# Patient Record
Sex: Female | Born: 1995
Health system: Southern US, Community
[De-identification: ages and names within clinical notes are randomized; demographics above are authoritative.]

## PROBLEM LIST (undated history)

## (undated) DIAGNOSIS — B86 Scabies: Secondary | ICD-10-CM

## (undated) HISTORY — DX: Scabies: B86

---
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
## 2017-08-10 NOTE — Progress Notes (Deleted)
   Subjective   Patient ID: Amy Farmer    DOB: 10-21-1995, 22 y.o. female   MRN: [id number]  CC: "***"  HPI: Amy Farmer is a 22 y.o. female who presents to clinic today for the following:  ***: ***  ROS: see HPI for pertinent.  PMFSH: None.  Surgical history joint replacement.  Family history unremarkable.  Smoking status reviewed.  Medications reviewed.  Objective   There were no vitals taken for this visit. Vitals and nursing note reviewed.  General: well nourished, well developed, NAD with non-toxic appearance HEENT: normocephalic, atraumatic, moist mucous membranes Neck: supple, non-tender without lymphadenopathy Cardiovascular: regular rate and rhythm without murmurs, rubs, or gallops Lungs: clear to auscultation bilaterally with normal work of breathing Abdomen: soft, non-tender, non-distended, normoactive bowel sounds Skin: warm, dry, no rashes or lesions, cap refill < 2 seconds Extremities: warm and well perfused, normal tone, no edema  Assessment & Plan   No problem-specific Assessment & Plan notes found for this encounter.  No orders of the defined types were placed in this encounter.  No orders of the defined types were placed in this encounter.   Durward Parcelavid McMullen, DO [facility], PGY-3 08/10/2017, 9:39 AM

## 2017-08-11 ENCOUNTER — Encounter: Payer: Medicaid Other | Admitting: Family Medicine

## 2017-08-18 ENCOUNTER — Ambulatory Visit (INDEPENDENT_AMBULATORY_CARE_PROVIDER_SITE_OTHER): Payer: Medicaid Other | Admitting: Family Medicine

## 2017-08-18 ENCOUNTER — Encounter: Payer: Self-pay | Admitting: Family Medicine

## 2017-08-18 VITALS — HR 94 | Temp 98.0°F | Ht 63.5 in | Wt 107.8 lb

## 2017-08-18 DIAGNOSIS — J029 Acute pharyngitis, unspecified: Secondary | ICD-10-CM

## 2017-08-18 DIAGNOSIS — R059 Cough, unspecified: Secondary | ICD-10-CM

## 2017-08-18 DIAGNOSIS — H938X3 Other specified disorders of ear, bilateral: Secondary | ICD-10-CM | POA: Diagnosis not present

## 2017-08-18 DIAGNOSIS — R05 Cough: Secondary | ICD-10-CM | POA: Diagnosis not present

## 2017-08-18 LAB — POCT RAPID STREP A (OFFICE): Rapid Strep A Screen: NEGATIVE

## 2017-08-18 MED ORDER — AMOXICILLIN-POT CLAVULANATE 875-125 MG PO TABS: 1.0000 | ORAL_TABLET | Freq: Two times a day (BID) | ORAL | 0 refills | Status: AC

## 2017-08-18 NOTE — Patient Instructions (Signed)
It was great seeing you today! I am sorry that you have had so much trouble with your sore throat, congestion, sinus pressure, and ear pressure. Your rapid strep was negative. There is a false negative rate with this test. You do have bulging ear drums which can indicate a true bacterial sinus infection. I will give you an antibiotic called augmentin. You will take this every 12 hours for 7 days. Please finish this even if you are feeling better. Please come back and see us if you are not feeling any better in 3-4 days.  Be sure and hydrate well and get plenty of sleep. Water, gatorade/powerade will help keep you hydrated. Good over the counter options are nyquil, robitussin cold and flu, and benzocaine lozenges. These will help symptomatically while the antibiotics help get rid of the bacteria. I gave you a work excuse for 7/17 and 7/18.

## 2017-08-19 ENCOUNTER — Encounter: Payer: Self-pay | Admitting: Family Medicine

## 2017-08-19 DIAGNOSIS — R059 Cough, unspecified: Secondary | ICD-10-CM | POA: Insufficient documentation

## 2017-08-19 DIAGNOSIS — J029 Acute pharyngitis, unspecified: Secondary | ICD-10-CM | POA: Insufficient documentation

## 2017-08-19 DIAGNOSIS — H938X3 Other specified disorders of ear, bilateral: Secondary | ICD-10-CM | POA: Insufficient documentation

## 2017-08-19 DIAGNOSIS — R05 Cough: Secondary | ICD-10-CM | POA: Insufficient documentation

## 2017-08-19 NOTE — Progress Notes (Signed)
   HPI 22 year old with 2 day history of progressively worsening sore throat, sinus congestion, and ear pain. Patient states that she was in her usual state of health but noticed her throat getting increasingly "scratchy" on 7/16. Has continued to worsen since. She also states that around the same time she developed congestion, bilateral ear pain, and cough. Has been intermittently febrile. Took an advil prior to coming to the office.  Rapid strep performed and was negative. Has had sick nephew and sick sister with very similar symptoms.  CC: sore throat   ROS:  Review of Systems See HPI for ROS.   CC, SH/smoking status, and VS noted  Objective: Pulse 94   Temp 98 F (36.7 C) (Oral)   Ht 5' 3.5" (1.613 m)   Wt 107 lb 12.8 oz (48.9 kg)   SpO2 99%   BMI 18.80 kg/m  Gen: NAD, alert, cooperative, and pleasant. Thin, caucasian female, resting comfortably in bed HEENT: NCAT, EOMI, PERRL. Tender, erythematous posterior pharynx. Bilateral bulging TMs. Swollen bilateral cervical lymph nodes CV: RRR, no murmur Resp: CTAB, no wheezes, non-labored Abd: SNTND, BS present, no guarding or organomegaly Ext: No edema, warm Neuro: Alert and oriented, Speech clear, No gross deficits   Assessment and plan:  Sore throat Strep negative. Sore throat likely 2/2 runoff from sinus congestion. Can treat with benzocain lozenges. Cannot rule out strep infection despite rapid strep negative given other symptoms. Augmentin prescribed for otitis media will cover pharyngeal flora. - benzocaine lozenges - augmentin 875/125 bid for 7 days  Congestion of both ears Patient with likely bilateral otitis media. Will treat with augmentin bid for 7 days.  Cough Has been taking OTC nyquil. Can continue to take this as needed for symptomatic relief. Will resolve with infection.   Orders Placed This Encounter  Procedures  . POCT rapid strep A    Meds ordered this encounter  Medications  .  amoxicillin-clavulanate (AUGMENTIN) 875-125 MG tablet    Sig: Take 1 tablet by mouth every 12 (twelve) hours for 7 days.    Dispense:  14 tablet    Refill:  0     Myrene BuddyJacob Aviv Rota MD PGY-2 Family Medicine Resident  08/19/2017 12:50 PM

## 2017-08-19 NOTE — Assessment & Plan Note (Signed)
Strep negative. Sore throat likely 2/2 runoff from sinus congestion. Can treat with benzocain lozenges. Cannot rule out strep infection despite rapid strep negative given other symptoms. Augmentin prescribed for otitis media will cover pharyngeal flora. - benzocaine lozenges - augmentin 875/125 bid for 7 days

## 2017-08-19 NOTE — Assessment & Plan Note (Signed)
Has been taking OTC nyquil. Can continue to take this as needed for symptomatic relief. Will resolve with infection.

## 2017-08-19 NOTE — Assessment & Plan Note (Signed)
Patient with likely bilateral otitis media. Will treat with augmentin bid for 7 days.

## 2017-09-08 NOTE — Progress Notes (Signed)
   Subjective   Patient ID: Amy Farmer    DOB: 04-Jan-1996, 22 y.o. female   MRN: [id number]  CC: "Routin physical"  HPI: Amy Farmer is a 22 y.o. female who presents to clinic today for the following:  Annual physical exam: Patient here today for routine annual physical without concerns.  She is a never smoker and denies alcohol or drug use.  She is up-to-date on vaccinations.  She was recently treated for sinusitis with Augmentin and completed treatment with resolution of symptoms with exception to mild nasal congestion.  She denies fevers or chills, nausea or vomiting, chest pain, shortness of breath, abdominal pain, diarrhea or constipation, syncope or fatigue.  Contraceptive management: Patient with historical use of OCPs with regular periods though discontinued after 8 months of use due to cramping.  She is currently using condoms distantly with intercourse.  She has one female partner for the past 3 years.  She has considered Depo-Provera but is interested in Nexplanon.  She is not interested in IUD.  She denies history of vaginal discharge or bleeding.  ROS: see HPI for pertinent.  PMFSH: None.  Surgical history unremarkable.  Family history unremarkable.  Smoking status reviewed. Medications reviewed.  Objective   BP (!) 96/58   Pulse 80   Temp 98.2 F (36.8 C) (Oral)   Ht 5\' 4"  (1.626 m)   Wt 111 lb 6.4 oz (50.5 kg)   SpO2 99%   BMI 19.12 kg/m  Vitals and nursing note reviewed.  General: well nourished, well developed, NAD with non-toxic appearance HEENT: normocephalic, atraumatic, moist mucous membranes Neck: supple, non-tender without lymphadenopathy Cardiovascular: regular rate and rhythm without murmurs, rubs, or gallops Lungs: clear to auscultation bilaterally with normal work of breathing Abdomen: soft, non-tender, non-distended, normoactive bowel sounds Skin: warm, dry, no rashes or lesions, cap refill < 2 seconds Extremities: warm and well  perfused, normal tone, no edema  Assessment & Plan   Contraceptive management Prior history of estrogen and progesterone based OCPs with cramping.  Currently condoms only.  Interested in [place]. - Given information regarding Nexplanon and scheduled appointment on 10/05/2017 for insertion - Discussed importance of safe sex practices including condom use to prevent STDs and pregnancy  Encounter for annual physical examination excluding gynecological examination in a patient older than 17 years No complaints today.  Not of age to begin Pap smear.  Patient reports completion of 3 series HPV vaccine.  She is sexually active without symptoms concerning for STD and is not interested in screening.   - Discussed importance of diet and exercise - We will begin Pap smear next year  No orders of the defined types were placed in this encounter.  No orders of the defined types were placed in this encounter.   Durward Parcelavid McMullen, DO [facility], PGY-3 09/09/2017, 1:44 PM

## 2017-09-09 ENCOUNTER — Ambulatory Visit (INDEPENDENT_AMBULATORY_CARE_PROVIDER_SITE_OTHER): Payer: Medicaid Other | Admitting: Family Medicine

## 2017-09-09 ENCOUNTER — Encounter: Payer: Self-pay | Admitting: Family Medicine

## 2017-09-09 ENCOUNTER — Other Ambulatory Visit: Payer: Self-pay

## 2017-09-09 VITALS — BP 96/58 | HR 80 | Temp 98.2°F | Ht 64.0 in | Wt 111.4 lb

## 2017-09-09 DIAGNOSIS — Z309 Encounter for contraceptive management, unspecified: Secondary | ICD-10-CM | POA: Insufficient documentation

## 2017-09-09 DIAGNOSIS — Z0000 Encounter for general adult medical examination without abnormal findings: Secondary | ICD-10-CM | POA: Diagnosis not present

## 2017-09-09 DIAGNOSIS — Z3009 Encounter for other general counseling and advice on contraception: Secondary | ICD-10-CM | POA: Diagnosis not present

## 2017-09-09 NOTE — Assessment & Plan Note (Signed)
Prior history of estrogen and progesterone based OCPs with cramping.  Currently condoms only.  Interested in [place]. - Given information regarding Nexplanon and scheduled appointment on 10/05/2017 for insertion - Discussed importance of safe sex practices including condom use to prevent STDs and pregnancy

## 2017-09-09 NOTE — Patient Instructions (Addendum)
Thank you for coming in to see us today. Please see below to review our plan for today's visit.  We will see you again in a few weeks.  Please feel free to cancel if needed.  Please call the clinic at [phone] if your symptoms worsen or you have any concerns. It was our pleasure to serve you.  Durward Parcelavid McMullen, DO [facility], PGY-3  Etonogestrel implant What is this medicine? ETONOGESTREL (et oh noe JES trel) is a contraceptive (birth control) device. It is used to prevent pregnancy. It can be used for up to 3 years. This medicine may be used for other purposes; ask your health care provider or pharmacist if you have questions. COMMON BRAND NAME(S): Implanon, Nexplanon What should I tell my health care provider before I take this medicine? They need to know if you have any of these conditions: -abnormal vaginal bleeding -blood vessel disease or blood clots -cancer of the breast, cervix, or liver -depression -diabetes -gallbladder disease -headaches -heart disease or recent heart attack -high blood pressure -high cholesterol -kidney disease -liver disease -renal disease -seizures -tobacco smoker -an unusual or allergic reaction to etonogestrel, other hormones, anesthetics or antiseptics, medicines, foods, dyes, or preservatives -pregnant or trying to get pregnant -breast-feeding How should I use this medicine? This device is inserted just under the skin on the inner side of your upper arm by a health care professional. Talk to your pediatrician regarding the use of this medicine in children. Special care may be needed. Overdosage: If you think you have taken too much of this medicine contact a poison control center or emergency room at once. NOTE: This medicine is only for you. Do not share this medicine with others. What if I miss a dose? This does not apply. What may interact with this medicine? Do not take this medicine with any of the following  medications: -amprenavir -bosentan -fosamprenavir This medicine may also interact with the following medications: -barbiturate medicines for inducing sleep or treating seizures -certain medicines for fungal infections like ketoconazole and itraconazole -grapefruit juice -griseofulvin -medicines to treat seizures like carbamazepine, felbamate, oxcarbazepine, phenytoin, topiramate -modafinil -phenylbutazone -rifampin -rufinamide -some medicines to treat HIV infection like atazanavir, indinavir, lopinavir, nelfinavir, tipranavir, ritonavir -St. John's wort This list may not describe all possible interactions. Give your health care provider a list of all the medicines, herbs, non-prescription drugs, or dietary supplements you use. Also tell them if you smoke, drink alcohol, or use illegal drugs. Some items may interact with your medicine. What should I watch for while using this medicine? This product does not protect you against HIV infection (AIDS) or other sexually transmitted diseases. You should be able to feel the implant by pressing your fingertips over the skin where it was inserted. Contact your doctor if you cannot feel the implant, and use a non-hormonal birth control method (such as condoms) until your doctor confirms that the implant is in place. If you feel that the implant may have broken or become bent while in your arm, contact your healthcare provider. What side effects may I notice from receiving this medicine? Side effects that you should report to your doctor or health care professional as soon as possible: -allergic reactions like skin rash, itching or hives, swelling of the face, lips, or tongue -breast lumps -changes in emotions or moods -depressed mood -heavy or prolonged menstrual bleeding -pain, irritation, swelling, or bruising at the insertion site -scar at site of insertion -signs of infection at the insertion  site such as fever, and skin redness, pain or  discharge -signs of pregnancy -signs and symptoms of a blood clot such as breathing problems; changes in vision; chest pain; severe, sudden headache; pain, swelling, warmth in the leg; trouble speaking; sudden numbness or weakness of the face, arm or leg -signs and symptoms of liver injury like dark yellow or brown urine; general ill feeling or flu-like symptoms; light-colored stools; loss of appetite; nausea; right upper belly pain; unusually weak or tired; yellowing of the eyes or skin -unusual vaginal bleeding, discharge -signs and symptoms of a stroke like changes in vision; confusion; trouble speaking or understanding; severe headaches; sudden numbness or weakness of the face, arm or leg; trouble walking; dizziness; loss of balance or coordination Side effects that usually do not require medical attention (report to your doctor or health care professional if they continue or are bothersome): -acne -back pain -breast pain -changes in weight -dizziness -general ill feeling or flu-like symptoms -headache -irregular menstrual bleeding -nausea -sore throat -vaginal irritation or inflammation This list may not describe all possible side effects. Call your doctor for medical advice about side effects. You may report side effects to FDA at 1-800-FDA-1088. Where should I keep my medicine? This drug is given in a hospital or clinic and will not be stored at home. NOTE: This sheet is a summary. It may not cover all possible information. If you have questions about this medicine, talk to your doctor, pharmacist, or health care provider.  2018 Elsevier/Gold Standard (2015-08-08 11:19:22)

## 2017-09-09 NOTE — Assessment & Plan Note (Signed)
No complaints today.  Not of age to begin Pap smear.  Patient reports completion of 3 series HPV vaccine.  She is sexually active without symptoms concerning for STD and is not interested in screening.   - Discussed importance of diet and exercise - We will begin Pap smear next year

## 2017-09-30 ENCOUNTER — Other Ambulatory Visit: Payer: Self-pay

## 2017-09-30 ENCOUNTER — Ambulatory Visit (INDEPENDENT_AMBULATORY_CARE_PROVIDER_SITE_OTHER): Payer: Medicaid Other | Admitting: Family Medicine

## 2017-09-30 VITALS — BP 100/58 | HR 81 | Temp 98.4°F | Wt 108.0 lb

## 2017-09-30 DIAGNOSIS — J020 Streptococcal pharyngitis: Secondary | ICD-10-CM

## 2017-09-30 LAB — POCT RAPID STREP A (OFFICE): Rapid Strep A Screen: POSITIVE — AB

## 2017-09-30 MED ORDER — PENICILLIN V POTASSIUM 500 MG PO TABS: 500.0000 mg | ORAL_TABLET | Freq: Three times a day (TID) | ORAL | 0 refills | Status: DC

## 2017-09-30 NOTE — Assessment & Plan Note (Signed)
Positive strep throat swab.  We will treat with penicillin V 500 mg, 3 times daily for 10 days.  Gave return precautions.  Also recommended benzocaine lozenges for sore throat.

## 2017-09-30 NOTE — Patient Instructions (Addendum)
It was great seeing you again today! I am sorry that you have been having issues with your sore throat, cough, and congestion. Your strep test was positive. We will need to treat with antibiotics. For the sore throat you can do over the counter medications such as benzocaine lozenge to help sooth your throat. Continue to hydrate well

## 2017-09-30 NOTE — Progress Notes (Signed)
   HPI 22 year old who presents with 3-day history of sore throat.  Developed rhinorrhea and very slight cough 1 day prior to sore throat.  Also been feeling some ear pressure in bilateral ears.  Has been having no difficulty with p.o. Intake.  States that she has had a couple of close contacts with similar symptoms.  Tried nothing to relieve the pain.  Has had temperatures up to 100.0 at home.  Has taken Tylenol once.   Strep swab performed in clinic.  Was positive.   CC: sore throat   ROS:   Review of Systems See HPI for ROS.   CC, SH/smoking status, and VS noted  Objective: BP (!) 100/58   Pulse 81   Temp 98.4 F (36.9 C) (Oral)   Wt 108 lb (49 kg)   LMP 09/22/2017   SpO2 93%   BMI 18.54 kg/m  Gen: NAD, alert, cooperative, and pleasant. Young caucasian female in no distress HEENT: NCAT, EOMI, PERRL. Erythematous posterior pharynx. No purulence from tonsils CV: RRR, no m/r/g. Resp: CTAB, no wheezes, non-labored Abd: SNTND, BS present, no guarding or organomegaly Ext: No edema, warm Neuro: Alert and oriented, Speech clear, No gross deficits   Assessment and plan:  Strep pharyngitis Positive strep throat swab.  We will treat with penicillin V 500 mg, 3 times daily for 10 days.  Gave return precautions.  Also recommended benzocaine lozenges for sore throat.    Orders Placed This Encounter  Procedures  . Rapid Strep A    Meds ordered this encounter  Medications  . penicillin v potassium (VEETID) 500 MG tablet    Sig: Take 1 tablet (500 mg total) by mouth 3 (three) times daily.    Dispense:  30 tablet    Refill:  0     Myrene BuddyJacob Ellsworth Waldschmidt MD PGY-2 Family Medicine Resident  09/30/2017 1:30 PM

## 2017-10-05 ENCOUNTER — Ambulatory Visit: Payer: Medicaid Other | Admitting: Family Medicine

## 2017-10-05 NOTE — Progress Notes (Deleted)
   Subjective   Patient ID: Dyanne Carrel    DOB: [birth year], 22 y.o. female   MRN: [id number]  CC: "***"  HPI: ISSAMAR HUCKABAY is a 22 y.o. female who presents to clinic today for the following:  ***: ***  ROS: see HPI for pertinent.  PMFSH: ***. Smoking status reviewed. Medications reviewed.  Objective   LMP 09/22/2017  Vitals and nursing note reviewed.  General: well nourished, well developed, NAD with non-toxic appearance HEENT: normocephalic, atraumatic, moist mucous membranes Neck: supple, non-tender without lymphadenopathy Cardiovascular: regular rate and rhythm without murmurs, rubs, or gallops Lungs: clear to auscultation bilaterally with normal work of breathing Abdomen: soft, non-tender, non-distended, normoactive bowel sounds Skin: warm, dry, no rashes or lesions, cap refill < 2 seconds Extremities: warm and well perfused, normal tone, no edema  Procedure Note: Nexplanon Insertion Patient given informed consent for insertion of a Nexplanon. A signed copy is in the chart. Appropriate time out taken. Nexlanon site (*** arm) identified and thea area was prepped in usual sterile fashon. Two cc of 1% lidocaine was used to anesthetize the area starting with the distal end of the implant. Next, the area was cleansed again and the Nexplanon was inserted without difficulty. There were no complications. Pressure and bandage was applied. Minimal blood loss. Pt was instructed to remove pressure bandage in a few hours, and keep insertion site covered with a bandaid for 3 days. Follow-up scheduled PRN problems  Assessment & Plan   No problem-specific Assessment & Plan notes found for this encounter.  No orders of the defined types were placed in this encounter.  No orders of the defined types were placed in this encounter.   Durward Parcel, DO [facility], PGY-3 10/05/2017, 1:40 PM

## 2017-10-18 DIAGNOSIS — H5213 Myopia, bilateral: Secondary | ICD-10-CM | POA: Diagnosis not present

## 2017-10-25 DIAGNOSIS — H5213 Myopia, bilateral: Secondary | ICD-10-CM | POA: Diagnosis not present

## 2017-11-05 DIAGNOSIS — H52223 Regular astigmatism, bilateral: Secondary | ICD-10-CM | POA: Diagnosis not present

## 2017-11-05 DIAGNOSIS — H5213 Myopia, bilateral: Secondary | ICD-10-CM | POA: Diagnosis not present

## 2018-03-16 ENCOUNTER — Ambulatory Visit (INDEPENDENT_AMBULATORY_CARE_PROVIDER_SITE_OTHER): Payer: Medicaid Other | Admitting: Family Medicine

## 2018-03-16 VITALS — BP 100/70 | HR 74 | Temp 97.7°F | Wt 114.2 lb

## 2018-03-16 DIAGNOSIS — J069 Acute upper respiratory infection, unspecified: Secondary | ICD-10-CM | POA: Diagnosis not present

## 2018-03-16 DIAGNOSIS — B9789 Other viral agents as the cause of diseases classified elsewhere: Secondary | ICD-10-CM | POA: Diagnosis not present

## 2018-03-16 NOTE — Patient Instructions (Signed)
Thank you for coming in to see [place] today. Please see below to review our plan for today's visit.  Your symptoms are consistent with a viral upper respiratory tract infection.  You do not have any signs of pneumonia or other bacterial infection.  You can continue taking Tylenol 650 mg every 6 hours as needed for fever or pain along with ibuprofen up to 800 mg every 8 hours.  Your symptoms should be improving by the weekend.  If you develop worsening symptoms after an initial recovery, let us know.  You can use honey for your cough and drink plenty of fluids.  Please call the clinic at [phone] if your symptoms worsen or you have any concerns. It was our pleasure to serve you.  Durward Parcel, DO [facility], PGY-3

## 2018-03-16 NOTE — Progress Notes (Signed)
   Subjective   Patient ID: Amy Farmer    DOB: 04/15/1995, 23 y.o. female   MRN: [id number]  CC: "I think I have RSV"  HPI: Amy Farmer is a 23 y.o. female who presents to clinic today for the following:  URI  Has been sick for 3 days. Nasal discharge: some, clear to yellow Medications tried: Alka-Seltzer Sick contacts: yes, niece had RSV  Symptoms Fever: yes, 100.25F Headache or face pain: some headache from coughing Tooth pain: no Sneezing: no Scratchy throat: yes Allergies: no Muscle aches: no Severe fatigue: no Stiff neck: no Shortness of breath: no Rash: no Sore throat or swollen glands: yes  ROS: see HPI for pertinent.  PMFSH: Reviewed. Smoking status reviewed. Medications reviewed.  Objective   BP 100/70   Pulse 74   Temp 97.7 F (36.5 C) (Axillary)   Wt 114 lb 3.2 oz (51.8 kg)   SpO2 100%   BMI 19.60 kg/m  Vitals and nursing note reviewed.  General: well nourished, well developed, NAD with non-toxic appearance HEENT: normocephalic, atraumatic, moist mucous membranes, minimal clear discharge, patent ear canals bilaterally with gray TMs, erythematous oropharynx, nasal congestion present without sinus tenderness Neck: supple, non-tender without lymphadenopathy Cardiovascular: regular rate and rhythm without murmurs, rubs, or gallops Lungs: clear to auscultation bilaterally with normal work of breathing, occasional dry cough present Abdomen: soft, non-tender, non-distended, normoactive bowel sounds Skin: warm, dry, no rashes or lesions, cap refill < 2 seconds Extremities: warm and well perfused, normal tone, no edema  Assessment & Plan   Viral URI with cough Recent contact with RSV positive children.  Clinically consistent with viral URI.  No signs of developing pneumonia or strep. - Discussed conservative management - Reviewed return precautions, RTC as needed  No orders of the defined types were placed in this encounter.  No orders  of the defined types were placed in this encounter.   Durward Parcel, DO [facility], PGY-3 03/16/2018, 2:51 PM

## 2018-03-16 NOTE — Assessment & Plan Note (Addendum)
Recent contact with RSV positive children.  Clinically consistent with viral URI.  No signs of developing pneumonia or strep. - Discussed conservative management - Reviewed return precautions, RTC as needed

## 2018-08-10 ENCOUNTER — Emergency Department (HOSPITAL_COMMUNITY): Payer: Medicaid Other

## 2018-08-10 ENCOUNTER — Other Ambulatory Visit: Payer: Self-pay

## 2018-08-10 ENCOUNTER — Emergency Department (HOSPITAL_COMMUNITY)
Admission: EM | Admit: 2018-08-10 | Discharge: 2018-08-10 | Disposition: A | Discharge status: Home / Self Care | Payer: Medicaid Other | Attending: Emergency Medicine | Admitting: Emergency Medicine

## 2018-08-10 ENCOUNTER — Encounter (HOSPITAL_COMMUNITY): Payer: Self-pay

## 2018-08-10 DIAGNOSIS — M25552 Pain in left hip: Secondary | ICD-10-CM | POA: Insufficient documentation

## 2018-08-10 DIAGNOSIS — S20319A Abrasion of unspecified front wall of thorax, initial encounter: Secondary | ICD-10-CM | POA: Diagnosis not present

## 2018-08-10 DIAGNOSIS — T07XXXA Unspecified multiple injuries, initial encounter: Secondary | ICD-10-CM

## 2018-08-10 DIAGNOSIS — W1830XA Fall on same level, unspecified, initial encounter: Secondary | ICD-10-CM | POA: Diagnosis not present

## 2018-08-10 DIAGNOSIS — S79911A Unspecified injury of right hip, initial encounter: Secondary | ICD-10-CM | POA: Diagnosis not present

## 2018-08-10 DIAGNOSIS — Y929 Unspecified place or not applicable: Secondary | ICD-10-CM | POA: Diagnosis not present

## 2018-08-10 DIAGNOSIS — Y999 Unspecified external cause status: Secondary | ICD-10-CM | POA: Insufficient documentation

## 2018-08-10 DIAGNOSIS — S20419A Abrasion of unspecified back wall of thorax, initial encounter: Secondary | ICD-10-CM | POA: Diagnosis not present

## 2018-08-10 DIAGNOSIS — S79912A Unspecified injury of left hip, initial encounter: Secondary | ICD-10-CM | POA: Diagnosis present

## 2018-08-10 DIAGNOSIS — M25551 Pain in right hip: Secondary | ICD-10-CM | POA: Diagnosis not present

## 2018-08-10 DIAGNOSIS — Y9302 Activity, running: Secondary | ICD-10-CM | POA: Insufficient documentation

## 2018-08-10 NOTE — ED Provider Notes (Signed)
[facility] EMERGENCY [facility]PARTMENT Provider Note  CSN: [id number] Arrival date & time: 08/10/18  1456    History   Chief Complaint Chief Complaint  Patient presents with  . Back Pain    HPI  Amy Farmer is a 23 y.o. female.     HPI    [age] YOF presents today status post fall.  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  She tried to run away from the bees but fell and landed on her right posterior hip.  She notes pain at the buttocks and posterior lateral hip.  She notes movement of the hip causes pain.  She denies any pain to the lower extremities, denies any midline back or neck pain.  No abdominal pain.  No medications prior to arrival.  Reports she is not pregnant.     Past Medical History:  Diagnosis Date  . Scabies 01/29/2012    Patient Active Problem List   Diagnosis Date Noted  . Viral URI with cough 03/16/2018  . Strep pharyngitis 09/30/2017  . Contraceptive management 09/09/2017    History reviewed. No pertinent surgical history.   OB History   No obstetric history on file.      Home Medications    Prior to Admission medications   Not on File    Family History Family History  Problem Relation Age of Onset  . Asthma Brother   . Diabetes Neg Hx     Social History Social History   Tobacco Use  . Smoking status: Never Smoker  . Smokeless tobacco: Never Used  Substance Use Topics  . Alcohol use: No  . Drug use: No     Allergies   Patient has no known allergies.   Review of Systems Review of Systems  All other systems reviewed and are negative.  Physical Exam Updated Vital Signs BP 124/65   Pulse 86   Temp 98.3 F (36.8 C) (Oral)   Resp 17   Ht 5\' 3"  (1.6 m)   Wt 52.2 kg   LMP 07/11/2018 (Approximate) Comment: pt denies any chance of pregnancy  SpO2 97%   BMI 20.37 kg/m   Physical Exam Vitals signs and nursing note reviewed.  Constitutional:      Appearance: She is  well-developed.  HENT:     Head: Normocephalic and atraumatic.  Eyes:     General: No scleral icterus.       Right eye: No discharge.        Left eye: No discharge.     Conjunctiva/sclera: Conjunctivae normal.     Pupils: Pupils are equal, round, and reactive to light.  Neck:     Musculoskeletal: Normal range of motion.     Vascular: No JVD.     Trachea: No tracheal deviation.  Pulmonary:     Effort: Pulmonary effort is normal.     Breath sounds: No stridor.  Musculoskeletal:     Comments: Superficial abrasion noted to mid thoracic back, tenderness palpation of right posterior and lateral hip, pain with flexion and extension at the hip, no bruising noted distal sensation strength motor function intact, hips are stable abdomen soft nontender  Neurological:     Mental Status: She is alert and oriented to person, place, and time.     Coordination: Coordination normal.  Psychiatric:        Behavior: Behavior normal.        Thought Content: Thought content normal.  Judgment: Judgment normal.      ED Treatments / Results  Labs (all labs ordered are listed, but only abnormal results are displayed) Labs Reviewed - No data to display  EKG None  Radiology No results found.  Procedures Procedures (including critical care time)  Medications Ordered in ED Medications - No data to display   Initial Impression / Assessment and Plan / ED Course  I have reviewed the triage vital signs and the nursing notes.  Pertinent labs & imaging results that were available during my care of the patient were reviewed by me and considered in my medical decision making (see chart for details).       Assessment/Plan: 60 YOF presents today with complaints of fall.  No bony abnormality well-appearing ambulatory.  Discharged with symptomatic care and strict return precautions.  She verbalized understanding and agreement to today's plan.  Final Clinical Impressions(s) / ED Diagnoses    Final diagnoses:  Pain of right hip joint  Multiple abrasions    ED Discharge Orders    None       Francee Gentile 08/15/18 [place], [place], DO 08/22/18 1117

## 2018-08-10 NOTE — ED Triage Notes (Signed)
Pt running from bees this afternoon when she fell on asphalt onto R hip; pt now having R hip and lower back pain; doesn't remember if she hit her head or not; denies loc

## 2018-08-10 NOTE — Discharge Instructions (Addendum)
Please read attached information. If you experience any new or worsening signs or symptoms please return to the emergency room for evaluation. Please follow-up with your primary care provider or specialist as discussed.  °

## 2018-08-10 NOTE — ED Notes (Signed)
Patient verbalizes understanding of discharge instructions. Opportunity for questioning and answering were provided.  patient discharged from ED.  

## 2018-12-16 ENCOUNTER — Other Ambulatory Visit: Payer: Self-pay

## 2018-12-16 DIAGNOSIS — Z20828 Contact with and (suspected) exposure to other viral communicable diseases: Secondary | ICD-10-CM | POA: Diagnosis not present

## 2018-12-16 DIAGNOSIS — Z20822 Contact with and (suspected) exposure to covid-19: Secondary | ICD-10-CM

## 2018-12-19 ENCOUNTER — Telehealth (INDEPENDENT_AMBULATORY_CARE_PROVIDER_SITE_OTHER): Payer: Medicaid Other | Admitting: Family Medicine

## 2018-12-19 LAB — NOVEL CORONAVIRUS, NAA: SARS-CoV-2, NAA: DETECTED — AB

## 2018-12-22 NOTE — Telephone Encounter (Signed)
Opened for MyChart message to notify patient of results.

## 2019-04-11 ENCOUNTER — Ambulatory Visit (INDEPENDENT_AMBULATORY_CARE_PROVIDER_SITE_OTHER): Payer: Medicaid Other | Admitting: Family Medicine

## 2019-04-11 ENCOUNTER — Other Ambulatory Visit: Payer: Self-pay

## 2019-04-11 VITALS — BP 120/60 | HR 104 | Ht 63.0 in | Wt 119.5 lb

## 2019-04-11 DIAGNOSIS — Z3201 Encounter for pregnancy test, result positive: Secondary | ICD-10-CM

## 2019-04-11 DIAGNOSIS — Z3200 Encounter for pregnancy test, result unknown: Secondary | ICD-10-CM

## 2019-04-11 LAB — POCT URINE PREGNANCY: Preg Test, Ur: POSITIVE — AB

## 2019-04-11 MED ORDER — PRENATAL + COMPLETE MULTI 0.267 & 373 MG PO THPK: 1.0000 | PACK | Freq: Every day | ORAL | 0 refills | Status: AC

## 2019-04-11 NOTE — Patient Instructions (Addendum)
Today we confirmed that you are pregnant via urine pregnancy test.  We will place a referral to the OB/GYN at [facility] as you requested.  We discussed that you wanted to do your lab work with them, I will also add that you seem to be due for a Pap smear which you can discuss the timing up with them as well.  If you need to take from our office please let us know, if they need a different referral paperwork I will be glad to provide whatever it is they need  Please remember to start the prenatal that we spoke about as well as avoid alcohol and tobacco.  Dr. Criss Rosales Prenatal Care Prenatal care is health care during pregnancy. It helps you and your unborn baby (fetus) stay as healthy as possible. Prenatal care may be provided by a midwife, a family practice health care provider, or a childbirth and pregnancy specialist (obstetrician). How does this affect me? During pregnancy, you will be closely monitored for any new conditions that might develop. To lower your risk of pregnancy complications, you and your health care provider will talk about any underlying conditions you have. How does this affect my baby? Early and consistent prenatal care increases the chance that your baby will be healthy during pregnancy. Prenatal care lowers the risk that your baby will be:  Born early (prematurely).  Smaller than expected at birth (small for gestational age). What can I expect at the first prenatal care visit? Your first prenatal care visit will likely be the longest. You should schedule your first prenatal care visit as soon as you know that you are pregnant. Your first visit is a good time to talk about any questions or concerns you have about pregnancy. At your visit, you and your health care provider will talk about:  Your medical history, including: ? Any past pregnancies. ? Your family's medical history. ? The baby's father's medical history. ? Any long-term (chronic) health conditions you have and  how you manage them. ? Any surgeries or procedures you have had. ? Any current over-the-counter or prescription medicines, herbs, or supplements you are taking.  Other factors that could pose a risk to your baby, including:  Your home setting and your stress levels, including: ? Exposure to abuse or violence. ? Household financial strain. ? Mental health conditions you have.  Your daily health habits, including diet and exercise. Your health care provider will also:  Measure your weight, height, and blood pressure.  Do a physical exam, including a pelvic and breast exam.  Perform blood tests and urine tests to check for: ? Urinary tract infection. ? Sexually transmitted infections (STIs). ? Low iron levels in your blood (anemia). ? Blood type and certain proteins on red blood cells (Rh antibodies). ? Infections and immunity to viruses, such as hepatitis B and rubella. ? HIV (human immunodeficiency virus).  Do an ultrasound to confirm your baby's growth and development and to help predict your estimated due date (EDD). This ultrasound is done with a probe that is inserted into the vagina (transvaginal ultrasound).  Discuss your options for genetic screening.  Give you information about how to keep yourself and your baby healthy, including: ? Nutrition and taking vitamins. ? Physical activity. ? How to manage pregnancy symptoms such as nausea and vomiting (morning sickness). ? Infections and substances that may be harmful to your baby and how to avoid them. ? Food safety. ? Dental care. ? Working. ? Travel. ? Warning signs  to watch for and when to call your health care provider. How often will I have prenatal care visits? After your first prenatal care visit, you will have regular visits throughout your pregnancy. The visit schedule is often as follows:  Up to week 28 of pregnancy: once every 4 weeks.  28-36 weeks: once every 2 weeks.  After 36 weeks: every week until  delivery. Some women may have visits more or less often depending on any underlying health conditions and the health of the baby. Keep all follow-up and prenatal care visits as told by your health care provider. This is important. What happens during routine prenatal care visits? Your health care provider will:  Measure your weight and blood pressure.  Check for fetal heart sounds.  Measure the height of your uterus in your abdomen (fundal height). This may be measured starting around week 20 of pregnancy.  Check the position of your baby inside your uterus.  Ask questions about your diet, sleeping patterns, and whether you can feel the baby move.  Review warning signs to watch for and signs of labor.  Ask about any pregnancy symptoms you are having and how you are dealing with them. Symptoms may include: ? Headaches. ? Nausea and vomiting. ? Vaginal discharge. ? Swelling. ? Fatigue. ? Constipation. ? Any discomfort, including back or pelvic pain. Make a list of questions to ask your health care provider at your routine visits. What tests might I have during prenatal care visits? You may have blood, urine, and imaging tests throughout your pregnancy, such as:  Urine tests to check for glucose, protein, or signs of infection.  Glucose tests to check for a form of diabetes that can develop during pregnancy (gestational diabetes mellitus). This is usually done around week 24 of pregnancy.  An ultrasound to check your baby's growth and development and to check for birth defects. This is usually done around week 20 of pregnancy.  A test to check for group B strep (GBS) infection. This is usually done around week 36 of pregnancy.  Genetic testing. This may include blood or imaging tests, such as an ultrasound. Some genetic tests are done during the first trimester and some are done during the second trimester. What else can I expect during prenatal care visits? Your health care  provider may recommend getting certain vaccines during pregnancy. These may include:  A yearly flu shot (annual influenza vaccine). This is especially important if you will be pregnant during flu season.  Tdap (tetanus, diphtheria, pertussis) vaccine. Getting this vaccine during pregnancy can protect your baby from whooping cough (pertussis) after birth. This vaccine may be recommended between weeks 27 and 36 of pregnancy. Later in your pregnancy, your health care provider may give you information about:  Childbirth and breastfeeding classes.  Choosing a health care provider for your baby.  Umbilical cord banking.  Breastfeeding.  Birth control after your baby is born.  The hospital labor and delivery unit and how to tour it.  Registering at the hospital before you go into labor. Where to find more information  Office on Women's Health: TravelLesson.ca  American Pregnancy Association: americanpregnancy.org  March of Dimes: marchofdimes.org Summary  Prenatal care helps you and your baby stay as healthy as possible during pregnancy.  Your first prenatal care visit will most likely be the longest.  You will have visits and tests throughout your pregnancy to monitor your health and your baby's health.  Bring a list of questions to your visits to  ask your health care provider.  Make sure to keep all follow-up and prenatal care visits with your health care provider. This information is not intended to replace advice given to you by your health care provider. Make sure you discuss any questions you have with your health care provider. Document Revised: 05/11/2018 Document Reviewed: 01/18/2017 Elsevier Patient Education  2020 Elsevier Inc.  Healthy Weight Gain During Pregnancy, Adult A certain amount of weight gain during pregnancy is normal and healthy. How much weight you should gain depends on your overall health and a measurement called BMI (body mass index). BMI is an  estimate of your body fat based on your height and weight. You can use an Software engineer to figure out your BMI, or you can ask your health care provider to calculate it for you at your next visit. Your recommended pregnancy weight gain is based on your pre-pregnancy BMI. General guidelines for a healthy total weight gain during pregnancy are listed below. If your BMI at or before the start of your pregnancy is:  Less than 18.5 (underweight), you should gain 28-40 lb (13-18 kg).  18.5-24.9 (normal weight), you should gain 25-35 lb (11-16 kg).  25-29.9 (overweight), you should gain 15-25 lb (7-11 kg).  30 or higher (obese), you should gain 11-20 lb (5-9 kg). These ranges vary depending on your individual health. If you are carrying more than one baby (multiples), it may be safe to gain more weight than these recommendations. If you gain less weight than recommended, that may be safe as long as your baby is growing and developing normally. How can unhealthy weight gain affect me and my baby? Gaining too much weight during pregnancy can lead to pregnancy complications, such as:  A temporary form of diabetes that develops during pregnancy (gestational diabetes).  High blood pressure during pregnancy and protein in your urine (preeclampsia).  High blood pressure during pregnancy without protein in your urine (gestational hypertension).  Your baby having a high weight at birth, which may: ? Raise your risk of having a more difficult delivery or a surgical delivery (cesarean delivery, or C-section). ? Raise your child's risk of developing obesity during childhood. Not gaining enough weight can be life-threatening for your baby, and it may raise your baby's chances of:  Being born early (preterm).  Growing more slowly than normal during pregnancy (growth restriction).  Having a low weight at birth. What actions can I take to gain a healthy amount of weight during pregnancy? General  instructions  Keep track of your weight gain during pregnancy.  Take over-the-counter and prescription medicines only as told by your health care provider. Take all prenatal supplements as directed.  Keep all health care visits during pregnancy (prenatal visits). These visits are a good time to discuss your weight gain. Your health care provider will weigh you at each visit to make sure you are gaining a healthy amount of weight. Nutrition   Eat a balanced, nutrient-rich diet. Eat plenty of: ? Fruits and vegetables, such as berries and broccoli. ? Whole grains, such as millet, barley, whole-wheat breads and cereals, and oatmeal. ? Low-fat dairy products or non-dairy products such as almond milk or rice milk. ? Protein foods, such as lean meat, chicken, eggs, and legumes (such as peas, beans, soybeans, and lentils).  Avoid foods that are fried or have a lot of fat, salt (sodium), or sugar.  Drink enough fluid to keep your urine pale yellow.  Choose healthy snack and drink options when  you are at work or on the go: ? Drink water. Avoid soda, sports drinks, and juices that have added sugar. ? Avoid drinks with caffeine, such as coffee and energy drinks. ? Eat snacks that are high in protein, such as nuts, protein bars, and low-fat yogurt. ? Carry convenient snacks in your purse that do not need refrigeration, such as a pack of trail mix, an apple, or a granola bar.  If you need help improving your diet, work with a health care provider or a diet and nutrition specialist (dietitian). Activity   Exercise regularly, as told by your health care provider. ? If you were active before becoming pregnant, you may be able to continue your regular fitness activities. ? If you were not active before pregnancy, you may gradually build up to exercising for 30 or more minutes on most days of the week. This may include walking, swimming, or yoga.  Ask your health care provider what activities are safe  for you. Talk with your health care provider about whether you may need to be excused from certain school or work activities. Where to find more information Learn more about managing your weight gain during pregnancy from:  American Pregnancy Association: www.americanpregnancy.org  U.S. Department of Agriculture pregnancy weight gain calculator: https://ball-collins.biz/ Summary  Too much weight gain during pregnancy can lead to complications for you and your baby.  Find out your pre-pregnancy BMI to determine how much weight gain is healthy for you.  Eat nutritious foods and stay active.  Keep all of your prenatal visits as told by your health care provider. This information is not intended to replace advice given to you by your health care provider. Make sure you discuss any questions you have with your health care provider. Document Revised: 10/12/2018 Document Reviewed: 10/09/2016 Elsevier Patient Education  2020 [employer].

## 2019-04-11 NOTE — Progress Notes (Signed)
    SUBJECTIVE:   CHIEF COMPLAINT / HPI: Confirmation of pregnancy  Patient has taken multiple pregnancy tests at home, all 10 have been positive.  She comes in today for a referral to OB/GYN (her sister had a pregnancy managed at [facility] in [place]) she had called that OB/GYN and they said that in order for them to schedule an appointment for her she would need to see her PCP and get a confirmatory test with a referral.  She said that she is safe at home, she was not expecting this pregnancy but is happy about it.  She says that she would prefer for all regularly scheduled prenatal labs to be performed with her eventual OB/GYN for continuity of care reasons and denies any need for STD testing at this time.  States FD LMP was February 6, denies any belly or abdominal pain, denies smoking or alcohol use, states this is her first known pregnancy.  She has not been taking prenatal  PERTINENT  PMH / PSH: None relevant  OBJECTIVE:   BP 120/60   Pulse (!) 104   Ht 5\' 3"  (1.6 m)   Wt 119 lb 8 oz (54.2 kg)   LMP 03/11/2019   SpO2 99%   BMI 21.17 kg/m   General: Pleasant, alert, appropriate weight Cardiac: Regular rate and rhythm to my exam, no murmurs heard Respiratory: Clear to auscultation bilaterally, no increased work of breathing, no cough no wheeze  ASSESSMENT/PLAN:   Positive pregnancy test Confirmed urine pregnancy test in our office  Referral placed to [facility] in [place] at patient's request, she will follow-up with them quickly and get an appointment scheduled.  She would like all of her prenatal labs and testing scheduled through their office for continuity.  She denies STD testing at this time says she is safe at home.  She does accept prescription for prenatal vitamins, prenatal information provided in after visit summary as well as reviewed verbally.     [place], DO [facility]

## 2019-04-12 DIAGNOSIS — Z3201 Encounter for pregnancy test, result positive: Secondary | ICD-10-CM | POA: Insufficient documentation

## 2019-04-12 NOTE — Assessment & Plan Note (Signed)
Confirmed urine pregnancy test in our office  Referral placed to [facility] in [facility] at patient's request, she will follow-up with them quickly and get an appointment scheduled.  She would like all of her prenatal labs and testing scheduled through their office for continuity.  She denies STD testing at this time says she is safe at home.  She does accept prescription for prenatal vitamins, prenatal information provided in after visit summary as well as reviewed verbally.

## 2019-04-28 DIAGNOSIS — Z3A01 Less than 8 weeks gestation of pregnancy: Secondary | ICD-10-CM | POA: Diagnosis not present

## 2019-04-28 DIAGNOSIS — Z3689 Encounter for other specified antenatal screening: Secondary | ICD-10-CM | POA: Diagnosis not present

## 2019-04-28 DIAGNOSIS — O3680X0 Pregnancy with inconclusive fetal viability, not applicable or unspecified: Secondary | ICD-10-CM | POA: Diagnosis not present

## 2019-04-28 DIAGNOSIS — Z1151 Encounter for screening for human papillomavirus (HPV): Secondary | ICD-10-CM | POA: Diagnosis not present

## 2019-04-28 DIAGNOSIS — Z124 Encounter for screening for malignant neoplasm of cervix: Secondary | ICD-10-CM | POA: Diagnosis not present

## 2019-04-28 DIAGNOSIS — Z01419 Encounter for gynecological examination (general) (routine) without abnormal findings: Secondary | ICD-10-CM | POA: Diagnosis not present

## 2019-06-09 DIAGNOSIS — Z3481 Encounter for supervision of other normal pregnancy, first trimester: Secondary | ICD-10-CM | POA: Diagnosis not present

## 2019-06-09 DIAGNOSIS — Z3682 Encounter for antenatal screening for nuchal translucency: Secondary | ICD-10-CM | POA: Diagnosis not present

## 2019-06-09 DIAGNOSIS — Z3689 Encounter for other specified antenatal screening: Secondary | ICD-10-CM | POA: Diagnosis not present

## 2019-06-09 DIAGNOSIS — Z3A13 13 weeks gestation of pregnancy: Secondary | ICD-10-CM | POA: Diagnosis not present

## 2019-07-07 DIAGNOSIS — Z3689 Encounter for other specified antenatal screening: Secondary | ICD-10-CM | POA: Diagnosis not present

## 2019-08-04 DIAGNOSIS — Z363 Encounter for antenatal screening for malformations: Secondary | ICD-10-CM | POA: Diagnosis not present

## 2019-08-04 DIAGNOSIS — Z3A20 20 weeks gestation of pregnancy: Secondary | ICD-10-CM | POA: Diagnosis not present

## 2019-09-26 DIAGNOSIS — O368130 Decreased fetal movements, third trimester, not applicable or unspecified: Secondary | ICD-10-CM | POA: Diagnosis not present

## 2019-09-26 DIAGNOSIS — Z3A28 28 weeks gestation of pregnancy: Secondary | ICD-10-CM | POA: Diagnosis not present

## 2019-09-29 DIAGNOSIS — Z3689 Encounter for other specified antenatal screening: Secondary | ICD-10-CM | POA: Diagnosis not present

## 2019-09-29 DIAGNOSIS — Z3A28 28 weeks gestation of pregnancy: Secondary | ICD-10-CM | POA: Diagnosis not present

## 2019-10-04 DIAGNOSIS — Z3689 Encounter for other specified antenatal screening: Secondary | ICD-10-CM | POA: Diagnosis not present

## 2019-10-04 DIAGNOSIS — Z131 Encounter for screening for diabetes mellitus: Secondary | ICD-10-CM | POA: Diagnosis not present

## 2019-10-13 DIAGNOSIS — Z23 Encounter for immunization: Secondary | ICD-10-CM | POA: Diagnosis not present

## 2019-10-20 DIAGNOSIS — O24410 Gestational diabetes mellitus in pregnancy, diet controlled: Secondary | ICD-10-CM | POA: Diagnosis not present

## 2019-10-20 DIAGNOSIS — Z713 Dietary counseling and surveillance: Secondary | ICD-10-CM | POA: Diagnosis not present

## 2019-10-20 DIAGNOSIS — O0993 Supervision of high risk pregnancy, unspecified, third trimester: Secondary | ICD-10-CM | POA: Diagnosis not present

## 2019-10-20 DIAGNOSIS — Z3A31 31 weeks gestation of pregnancy: Secondary | ICD-10-CM | POA: Diagnosis not present

## 2019-11-03 DIAGNOSIS — O24410 Gestational diabetes mellitus in pregnancy, diet controlled: Secondary | ICD-10-CM | POA: Diagnosis not present

## 2019-11-03 DIAGNOSIS — Z713 Dietary counseling and surveillance: Secondary | ICD-10-CM | POA: Diagnosis not present

## 2019-11-03 DIAGNOSIS — O0993 Supervision of high risk pregnancy, unspecified, third trimester: Secondary | ICD-10-CM | POA: Diagnosis not present

## 2019-11-03 DIAGNOSIS — Z3A33 33 weeks gestation of pregnancy: Secondary | ICD-10-CM | POA: Diagnosis not present

## 2019-11-17 DIAGNOSIS — Z713 Dietary counseling and surveillance: Secondary | ICD-10-CM | POA: Diagnosis not present

## 2019-11-17 DIAGNOSIS — O0993 Supervision of high risk pregnancy, unspecified, third trimester: Secondary | ICD-10-CM | POA: Diagnosis not present

## 2019-11-17 DIAGNOSIS — Z113 Encounter for screening for infections with a predominantly sexual mode of transmission: Secondary | ICD-10-CM | POA: Diagnosis not present

## 2019-11-17 DIAGNOSIS — Z1159 Encounter for screening for other viral diseases: Secondary | ICD-10-CM | POA: Diagnosis not present

## 2019-11-17 DIAGNOSIS — Z3A35 35 weeks gestation of pregnancy: Secondary | ICD-10-CM | POA: Diagnosis not present

## 2019-11-17 DIAGNOSIS — Z3A36 36 weeks gestation of pregnancy: Secondary | ICD-10-CM | POA: Diagnosis not present

## 2019-11-17 DIAGNOSIS — O24410 Gestational diabetes mellitus in pregnancy, diet controlled: Secondary | ICD-10-CM | POA: Diagnosis not present

## 2019-11-24 DIAGNOSIS — Z3A36 36 weeks gestation of pregnancy: Secondary | ICD-10-CM | POA: Diagnosis not present

## 2019-11-24 DIAGNOSIS — O24410 Gestational diabetes mellitus in pregnancy, diet controlled: Secondary | ICD-10-CM | POA: Diagnosis not present

## 2019-11-30 DIAGNOSIS — O24410 Gestational diabetes mellitus in pregnancy, diet controlled: Secondary | ICD-10-CM | POA: Diagnosis not present

## 2019-11-30 DIAGNOSIS — Z713 Dietary counseling and surveillance: Secondary | ICD-10-CM | POA: Diagnosis not present

## 2019-11-30 DIAGNOSIS — Z3A37 37 weeks gestation of pregnancy: Secondary | ICD-10-CM | POA: Diagnosis not present

## 2019-11-30 DIAGNOSIS — O0993 Supervision of high risk pregnancy, unspecified, third trimester: Secondary | ICD-10-CM | POA: Diagnosis not present

## 2019-12-11 DIAGNOSIS — Z3A39 39 weeks gestation of pregnancy: Secondary | ICD-10-CM | POA: Diagnosis not present

## 2019-12-11 DIAGNOSIS — O24420 Gestational diabetes mellitus in childbirth, diet controlled: Secondary | ICD-10-CM | POA: Diagnosis not present

## 2020-01-08 DIAGNOSIS — Z30011 Encounter for initial prescription of contraceptive pills: Secondary | ICD-10-CM | POA: Diagnosis not present

## 2020-01-08 DIAGNOSIS — Z130 Encounter for screening for diseases of the blood and blood-forming organs and certain disorders involving the immune mechanism: Secondary | ICD-10-CM | POA: Diagnosis not present

## 2020-11-17 IMAGING — CR DG HIP (WITH OR WITHOUT PELVIS) 2-3V RIGHT
3 series · 3 of 3 positions shown · non-contrast
Comparison: None.

CLINICAL DATA: Recent fall with right hip pain, initial encounter

EXAM:
DG HIP (WITH OR WITHOUT PELVIS) 3V RIGHT

[pelvis ap]
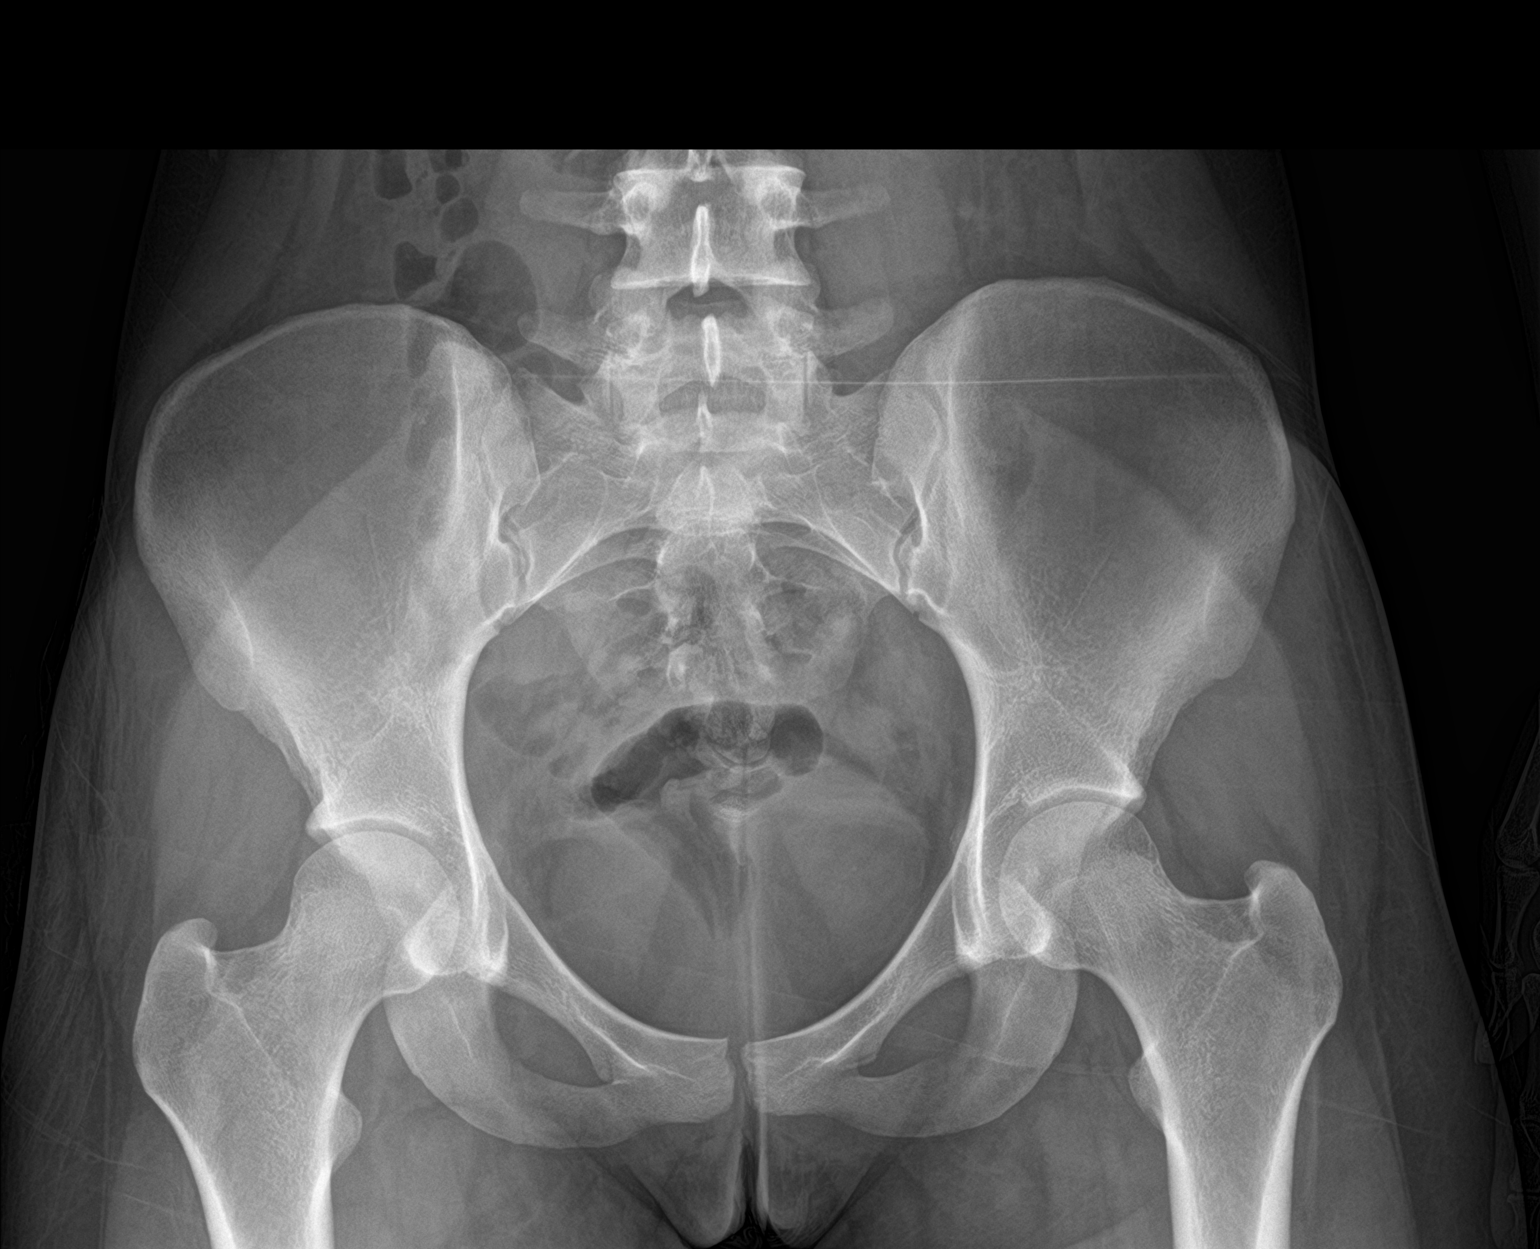

[hip ap]
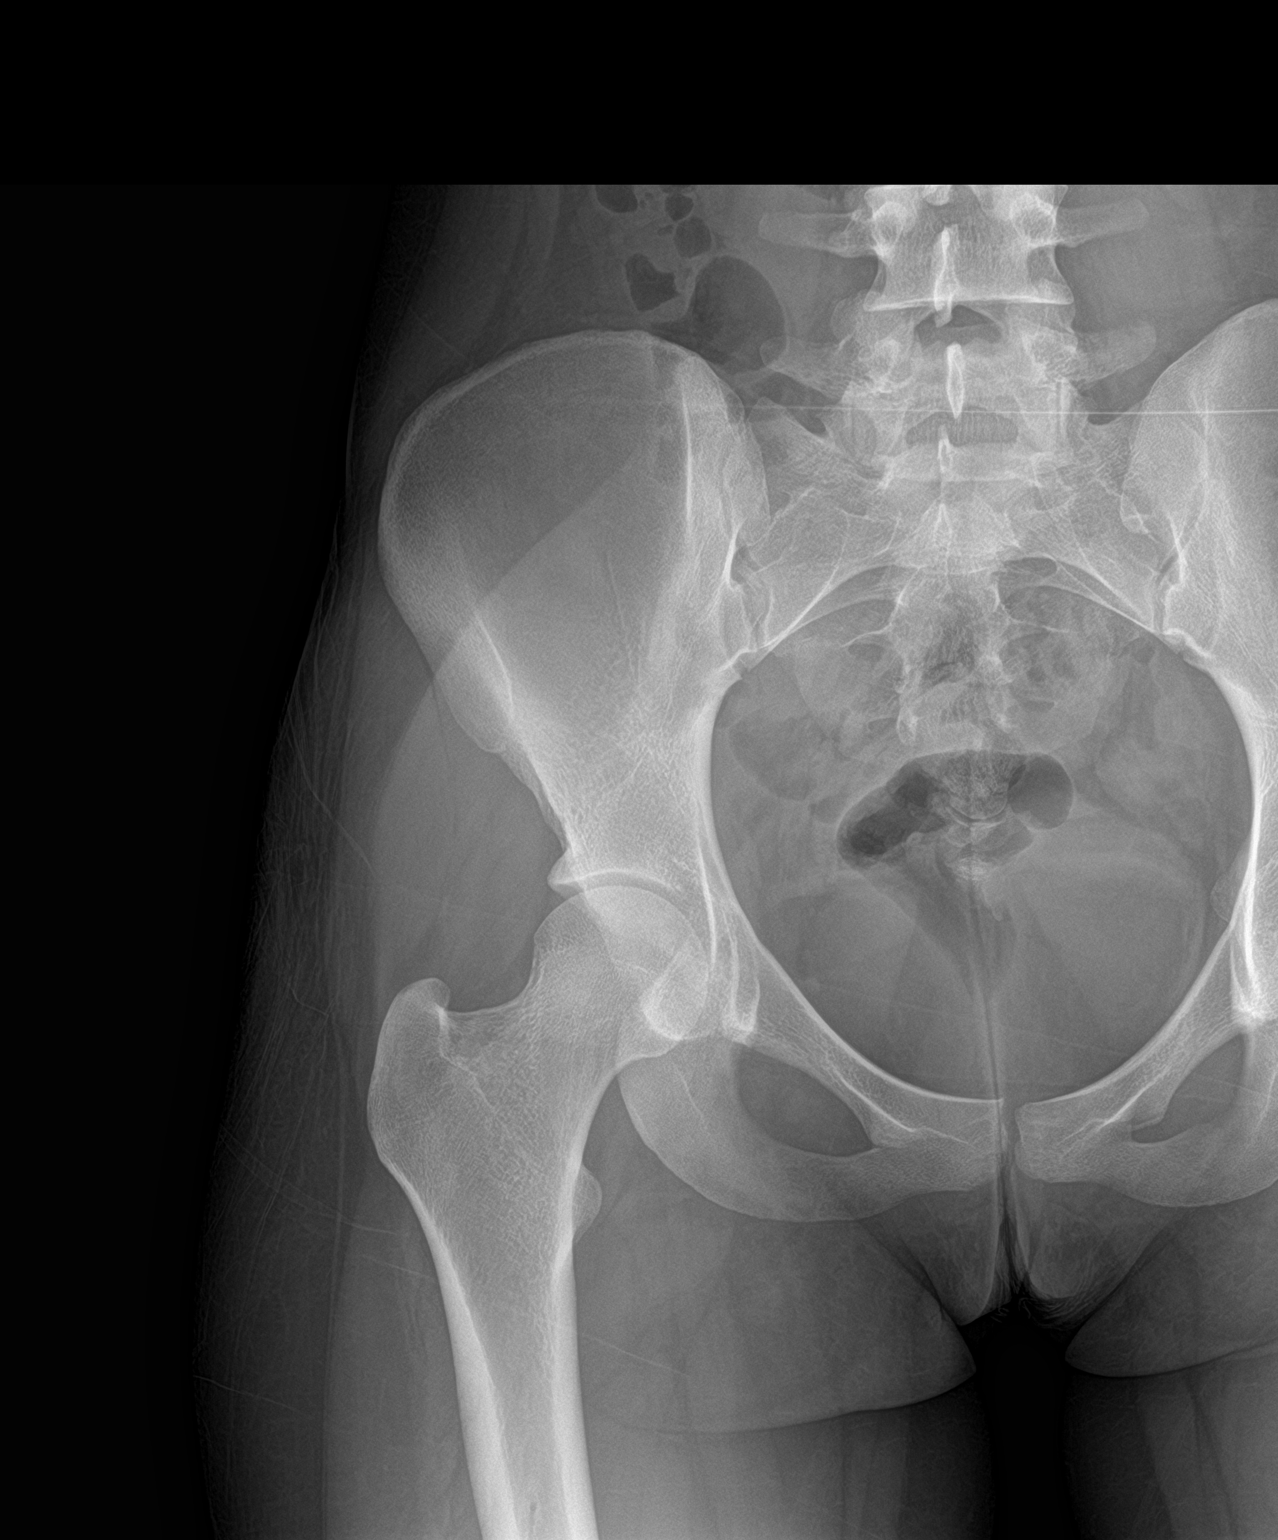

[hip lat]
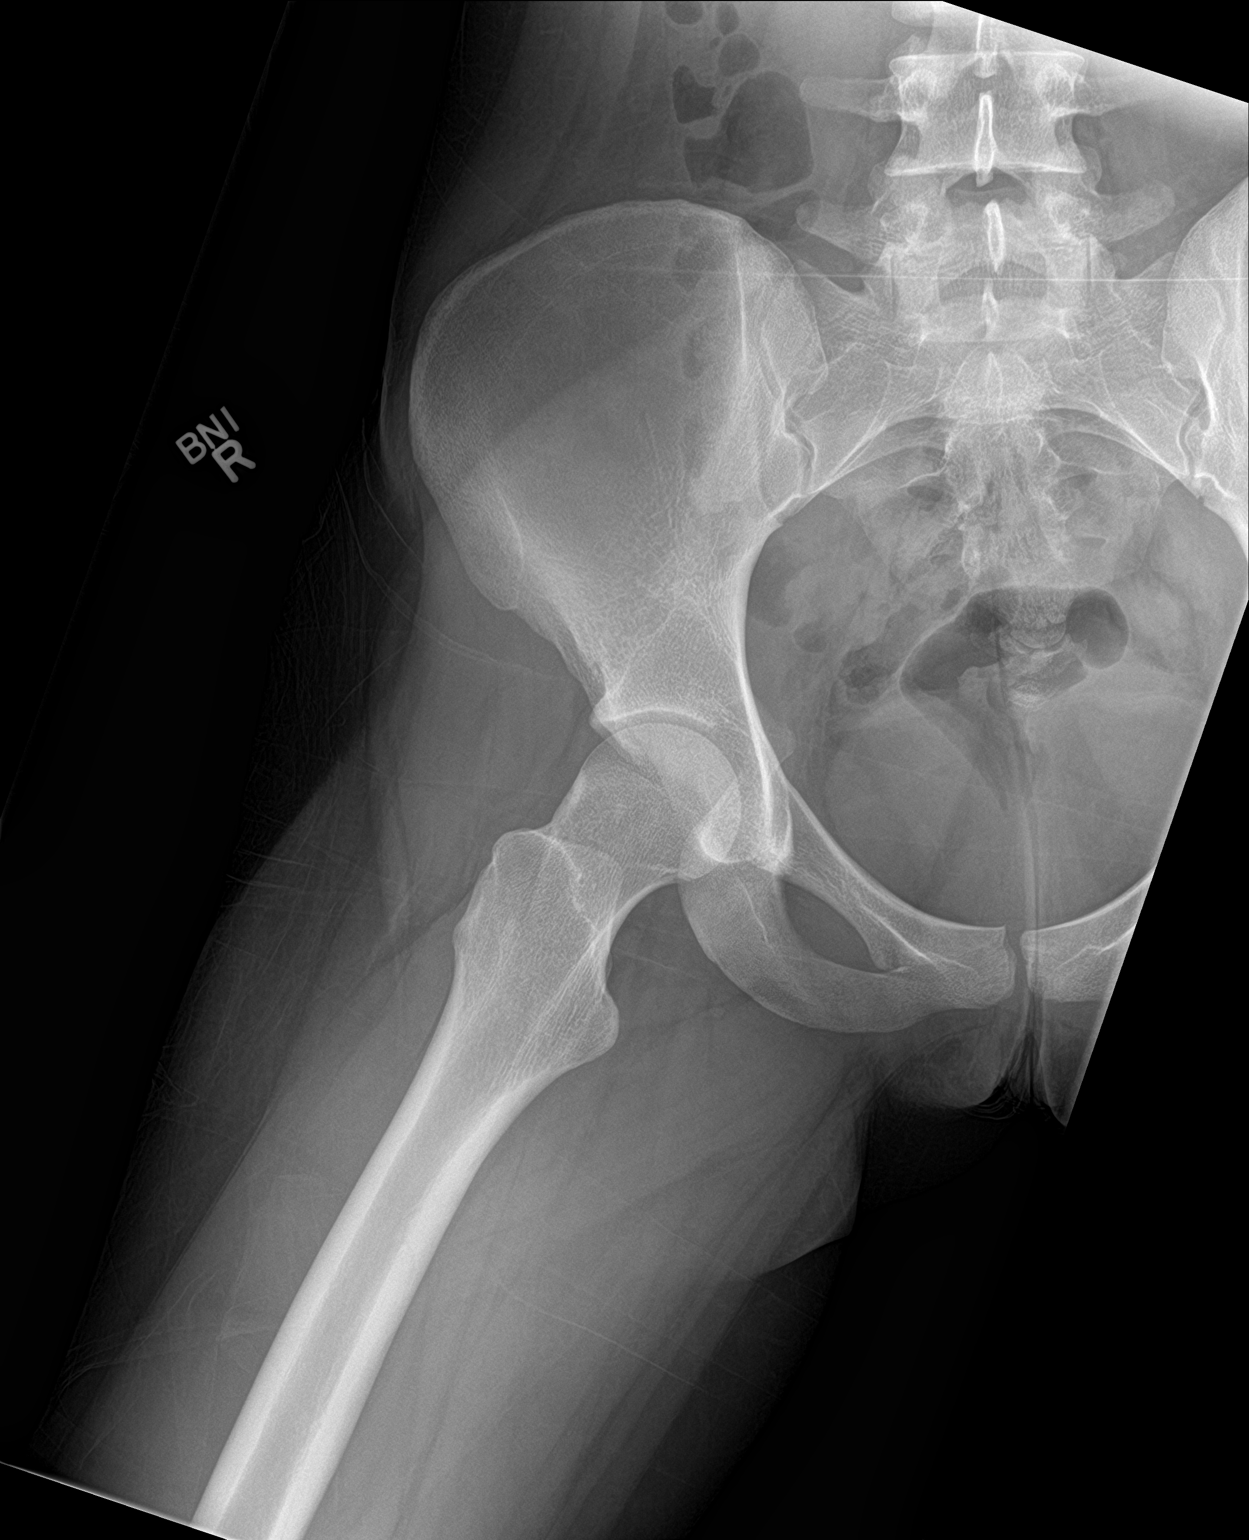

[3 of 3 positions shown; findings below may reference images not displayed]

FINDINGS: Pelvic ring is intact. No acute fracture or dislocation is seen. No
soft tissue abnormality is noted.
IMPRESSION: No acute abnormality noted.

## 2021-07-08 ENCOUNTER — Encounter: Payer: Self-pay | Admitting: *Deleted

## 2021-09-08 DIAGNOSIS — M79641 Pain in right hand: Secondary | ICD-10-CM | POA: Diagnosis not present

## 2021-09-10 DIAGNOSIS — S62622A Displaced fracture of medial phalanx of right middle finger, initial encounter for closed fracture: Secondary | ICD-10-CM | POA: Diagnosis not present

## 2022-02-06 DIAGNOSIS — Z113 Encounter for screening for infections with a predominantly sexual mode of transmission: Secondary | ICD-10-CM | POA: Diagnosis not present

## 2022-02-06 DIAGNOSIS — N760 Acute vaginitis: Secondary | ICD-10-CM | POA: Diagnosis not present

## 2022-02-06 DIAGNOSIS — B9689 Other specified bacterial agents as the cause of diseases classified elsewhere: Secondary | ICD-10-CM | POA: Diagnosis not present

## 2022-02-06 DIAGNOSIS — B3731 Acute candidiasis of vulva and vagina: Secondary | ICD-10-CM | POA: Diagnosis not present

## 2022-02-13 DIAGNOSIS — L739 Follicular disorder, unspecified: Secondary | ICD-10-CM | POA: Diagnosis not present

## 2022-03-12 ENCOUNTER — Ambulatory Visit (INDEPENDENT_AMBULATORY_CARE_PROVIDER_SITE_OTHER): Payer: Medicaid Other | Admitting: Student

## 2022-03-12 VITALS — BP 102/58 | HR 69 | Wt 120.0 lb

## 2022-03-12 DIAGNOSIS — M545 Low back pain, unspecified: Secondary | ICD-10-CM

## 2022-03-12 MED ORDER — MELOXICAM 7.5 MG PO TABS: 7.5000 mg | ORAL_TABLET | Freq: Every day | ORAL | 0 refills | Status: AC

## 2022-03-12 NOTE — Progress Notes (Signed)
    SUBJECTIVE:   CHIEF COMPLAINT / HPI:   Patient is a 27 year old female presenting today for lower back pain.  Back pain started about 3-4 weeks ago.  Patient denies any trauma to the back.  Describes it as achy pain in a seated position.  Back pain is worse with bending, patient denies any lower extremity paresthesia.  Initially started on the left back and now pain is across the lower back.  Denies any urinary or bowel incontinence.  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  Also she reports her son is 83 years old and she has some difficulty lifting him up.  PERTINENT  PMH / PSH: Reviewed   OBJECTIVE:   BP (!) 102/58   Pulse 69   Wt 120 lb (54.4 kg)   LMP 02/17/2022   SpO2 98%   BMI 21.26 kg/m    Physical Exam General: Alert, well appearing, NAD  Back No gross deformity, scoliosis. No TTP currently.  No midline or bony TTP. FROM. Pain with resisted flexion of the right and left LE Strength LEs 5/5 all muscle groups.   Negative SLRs. Sensation intact to light touch bilaterally.  Skin: 0.5cm well circumscribed raised size lesion on the anterior neck and left axilla.      ASSESSMENT/PLAN:   Back pain  The nature of her back pain is more consistent with musculoskeletal in the setting of muscle strain.  No red flag symptoms, denies incontinence or saddle paresthesia. She has 5/5 muscle strength on all extremities with sensation intact.  -Recommend use of lidocaine patch or Voltaren gel -Rx Meloxicam daily for 7 days -Placed PT referral  Skin lesion Skin lesion (see picture above) appears benign. Suspect possible seborrheic keratosis. -Issued patient in 2 weeks dermatology clinic for removal of lesion.  Alen Bleacher, MD [place]

## 2022-03-12 NOTE — Patient Instructions (Signed)
It was wonderful to meet you today. Thank you for allowing me to be a part of your care. Below is a short summary of what we discussed at your visit today:  Back pain is likely due to muscle strain.  I have sent in prescription for meloxicam which you will take once daily for 7 days.  I recommend using over-the-counter lidocaine patch on the affected area.  I have also sent in referral for physical therapy.  They will call you to schedule an appointment.  For your skin lesions, these look benign.been scheduled with our dermatology clinic for removal of these lesions.   Please bring all of your medications to every appointment!  If you have any questions or concerns, please do not hesitate to contact us via phone or MyChart message.   Alen Bleacher, MD [facility]

## 2022-03-19 ENCOUNTER — Ambulatory Visit: Payer: Medicaid Other

## 2022-04-09 ENCOUNTER — Ambulatory Visit (INDEPENDENT_AMBULATORY_CARE_PROVIDER_SITE_OTHER): Payer: Medicaid Other | Admitting: Family Medicine

## 2022-04-09 VITALS — BP 127/76 | HR 78 | Ht 63.0 in | Wt 125.4 lb

## 2022-04-09 DIAGNOSIS — L918 Other hypertrophic disorders of the skin: Secondary | ICD-10-CM

## 2022-04-09 NOTE — Patient Instructions (Signed)

## 2022-04-09 NOTE — Progress Notes (Signed)
    SUBJECTIVE:   CHIEF COMPLAINT / HPI:   Skin tags: She has multiple skin tags on her chin and belly that have been there for more than five years. They have not rapidly changed in size or color, but the one on the chin gets irritated. She would like to get both removed today.  PERTINENT  PMH / PSH: PMHx reviewed  OBJECTIVE:   BP 127/76   Pulse 78   Ht '5\' 3"'$  (1.6 m)   Wt 125 lb 6.4 oz (56.9 kg)   LMP 03/20/2022 (Approximate)   SpO2 100%   BMI 22.21 kg/m   Physical Exam Vitals and nursing note reviewed.  Constitutional:      Appearance: Normal appearance.  Skin:    Comments: 1cm by 1.5cm mildly sessile soft, skin color lesion on her left chin, which is non-tender or erythematous. Similar lesion, but pedunculated and smaller on her left periumbilical region.      ASSESSMENT/PLAN:   Skin tags: Treatment options discussed. She opted for surgical removal. See procedure note.   Excision of Benign Skin Lesion Procedure Note  PRE-OP DIAGNOSIS:Left abdomen and left chin skin tags  POST-OP DIAGNOSIS: Same   PROCEDURE TYPE: skin lesion excision   PROCEDURE PROCESS: Scissors  excision  The area surrounding the skin lesion was prepared and draped in the  usual sterile manner. The lesions was removed in the usual manner by the  biopsy method noted above. Her left chin lesion required two stiches. Hemostasis was assured.  Closure: suture - left chin _                   _  None for left abdominal tag removal.  Followup: The patient because hot a bit dizzy during the procedure which resolved after rest and drinking water. Otherwise, no  complications.  Standard post-procedure care is explained and return  precautions are given.  F/U in 7-14 days for suture removal - two stitches placed on her chin.   Andrena Mews, MD [place]

## 2022-04-20 ENCOUNTER — Ambulatory Visit: Payer: Self-pay | Admitting: Student

## 2022-12-21 DIAGNOSIS — X58XXXA Exposure to other specified factors, initial encounter: Secondary | ICD-10-CM | POA: Diagnosis not present

## 2022-12-21 DIAGNOSIS — H18821 Corneal disorder due to contact lens, right eye: Secondary | ICD-10-CM | POA: Diagnosis not present

## 2022-12-21 DIAGNOSIS — S0501XA Injury of conjunctiva and corneal abrasion without foreign body, right eye, initial encounter: Secondary | ICD-10-CM | POA: Diagnosis not present

## 2022-12-21 DIAGNOSIS — H5711 Ocular pain, right eye: Secondary | ICD-10-CM | POA: Diagnosis not present

## 2023-02-15 ENCOUNTER — Ambulatory Visit (INDEPENDENT_AMBULATORY_CARE_PROVIDER_SITE_OTHER): Payer: Medicaid Other | Admitting: Student

## 2023-02-15 ENCOUNTER — Encounter: Payer: Self-pay | Admitting: Student

## 2023-02-15 VITALS — BP 124/65 | HR 88 | Ht 63.0 in | Wt 131.2 lb

## 2023-02-15 DIAGNOSIS — J358 Other chronic diseases of tonsils and adenoids: Secondary | ICD-10-CM

## 2023-02-15 NOTE — Progress Notes (Signed)
    SUBJECTIVE:   CHIEF COMPLAINT / HPI:   Tonsil stones First noticed approximately 3-4 weeks ago.  Not painful.  History of prior strep infections.  No current sick symptoms.  She is now manually removed multiple stones on her own.  Using salt water gargles in the morning, but often helps release the stones.  Occasionally she is seeing blood after gargling in the morning, and sometimes has 3/10 pain in her pharynx.  No fevers, cough, dysphagia, shortness of breath.   PERTINENT  PMH / PSH: None  OBJECTIVE:   BP 124/65   Pulse 88   Ht 5' 3 (1.6 m)   Wt 131 lb 4 oz (59.5 kg)   LMP 01/22/2023   SpO2 100%   BMI 23.25 kg/m    General: NAD, pleasant HEENT: Normocephalic, atraumatic head. Normal external ear, canal, TM bilaterally. EOM intact and normal conjunctiva BL. Normal external nose.  Pharynx is mildly erythematous, no exudate, no visible tonsillolith, no deviation. Normal dentition. Cardio: RRR, no MRG. Cap Refill <2s. Respiratory: CTAB, normal wob on RA Skin: Warm and dry  ASSESSMENT/PLAN:   Assessment & Plan Tonsillolith Multiple tonsillitis present for 3-4 weeks.  No prior history.  No evidence of infection, nor visible tonsil with on exam today.  Discussed supportive care measures.  Handout provided. - Supportive care measures - Return precautions discussed - Consider referral to ENT if persistent/painful  Gladis Church, DO [facility]

## 2023-02-15 NOTE — Patient Instructions (Signed)
 What are Tonsilloliths?  Tonsilloliths, also known as tonsil stones, are small, hard deposits that form in the crevices of your tonsils. They are made up of debris such as dead cells, food particles, and bacteria. These stones can cause discomfort and bad breath (halitosis) but are generally not harmful.  Symptoms of Tonsilloliths  Bad breath  Sore throat  Difficulty swallowing  Ear pain  Visible white or yellowish stones in the tonsils  Managing Tonsilloliths  Most tonsilloliths can be managed with simple home care:  Good Oral Hygiene: Brush your teeth and tongue regularly to reduce bacteria and debris in your mouth.  Gargling: Use salt water or mouthwash to help dislodge stones and reduce bacteria.  Hydration: Drink plenty of water to keep your mouth moist and help flush out debris.  When to See a Doctor If tonsilloliths cause significant discomfort, recurrent infections, or do not improve with home care, you may need to see a doctor. In some cases, medical or surgical treatments may be necessary:   Laser Cryptolysis: A procedure that uses a laser to smooth the surface of the tonsils and reduce the crevices where stones form.  Tonsillectomy: Surgical removal of the tonsils may be considered in severe or recurrent cases.  Preventing Tonsilloliths  Maintain good oral hygiene.  Stay hydrated.  Avoid smoking and other irritants.  If you have any concerns or persistent symptoms, please consult your healthcare provider for further evaluation and management.

## 2023-03-31 DIAGNOSIS — Z01419 Encounter for gynecological examination (general) (routine) without abnormal findings: Secondary | ICD-10-CM | POA: Diagnosis not present

## 2023-03-31 DIAGNOSIS — B3731 Acute candidiasis of vulva and vagina: Secondary | ICD-10-CM | POA: Diagnosis not present

## 2023-03-31 DIAGNOSIS — Z0001 Encounter for general adult medical examination with abnormal findings: Secondary | ICD-10-CM | POA: Diagnosis not present

## 2023-03-31 DIAGNOSIS — Z113 Encounter for screening for infections with a predominantly sexual mode of transmission: Secondary | ICD-10-CM | POA: Diagnosis not present

## 2023-03-31 DIAGNOSIS — Z124 Encounter for screening for malignant neoplasm of cervix: Secondary | ICD-10-CM | POA: Diagnosis not present

## 2023-09-14 DIAGNOSIS — Z3201 Encounter for pregnancy test, result positive: Secondary | ICD-10-CM | POA: Diagnosis not present

## 2023-09-14 DIAGNOSIS — Z3689 Encounter for other specified antenatal screening: Secondary | ICD-10-CM | POA: Diagnosis not present

## 2023-09-14 DIAGNOSIS — R110 Nausea: Secondary | ICD-10-CM | POA: Diagnosis not present

## 2023-09-14 DIAGNOSIS — Z3A09 9 weeks gestation of pregnancy: Secondary | ICD-10-CM | POA: Diagnosis not present

## 2023-09-14 DIAGNOSIS — O26891 Other specified pregnancy related conditions, first trimester: Secondary | ICD-10-CM | POA: Diagnosis not present

## 2023-09-14 DIAGNOSIS — Z3687 Encounter for antenatal screening for uncertain dates: Secondary | ICD-10-CM | POA: Diagnosis not present

## 2023-10-05 DIAGNOSIS — Z8632 Personal history of gestational diabetes: Secondary | ICD-10-CM | POA: Diagnosis not present

## 2023-10-05 DIAGNOSIS — O09291 Supervision of pregnancy with other poor reproductive or obstetric history, first trimester: Secondary | ICD-10-CM | POA: Diagnosis not present

## 2023-10-05 DIAGNOSIS — Z3A12 12 weeks gestation of pregnancy: Secondary | ICD-10-CM | POA: Diagnosis not present

## 2023-10-05 DIAGNOSIS — Z3689 Encounter for other specified antenatal screening: Secondary | ICD-10-CM | POA: Diagnosis not present

## 2023-10-05 DIAGNOSIS — Z141 Cystic fibrosis carrier: Secondary | ICD-10-CM | POA: Diagnosis not present

## 2023-10-05 DIAGNOSIS — Z2839 Other underimmunization status: Secondary | ICD-10-CM | POA: Diagnosis not present

## 2023-10-05 DIAGNOSIS — O09891 Supervision of other high risk pregnancies, first trimester: Secondary | ICD-10-CM | POA: Diagnosis not present

## 2023-10-05 DIAGNOSIS — Z3481 Encounter for supervision of other normal pregnancy, first trimester: Secondary | ICD-10-CM | POA: Diagnosis not present

## 2023-10-11 DIAGNOSIS — Z3689 Encounter for other specified antenatal screening: Secondary | ICD-10-CM | POA: Diagnosis not present

## 2023-11-30 DIAGNOSIS — Z2839 Other underimmunization status: Secondary | ICD-10-CM | POA: Diagnosis not present

## 2023-11-30 DIAGNOSIS — Z3A20 20 weeks gestation of pregnancy: Secondary | ICD-10-CM | POA: Diagnosis not present

## 2023-11-30 DIAGNOSIS — Z3689 Encounter for other specified antenatal screening: Secondary | ICD-10-CM | POA: Diagnosis not present

## 2023-11-30 DIAGNOSIS — Z141 Cystic fibrosis carrier: Secondary | ICD-10-CM | POA: Diagnosis not present

## 2023-11-30 DIAGNOSIS — Z8632 Personal history of gestational diabetes: Secondary | ICD-10-CM | POA: Diagnosis not present

## 2023-11-30 DIAGNOSIS — O09891 Supervision of other high risk pregnancies, first trimester: Secondary | ICD-10-CM | POA: Diagnosis not present

## 2023-11-30 DIAGNOSIS — O09299 Supervision of pregnancy with other poor reproductive or obstetric history, unspecified trimester: Secondary | ICD-10-CM | POA: Diagnosis not present

## 2023-11-30 DIAGNOSIS — O09292 Supervision of pregnancy with other poor reproductive or obstetric history, second trimester: Secondary | ICD-10-CM | POA: Diagnosis not present

## 2023-12-15 DIAGNOSIS — R1030 Lower abdominal pain, unspecified: Secondary | ICD-10-CM | POA: Diagnosis not present

## 2023-12-15 DIAGNOSIS — O26892 Other specified pregnancy related conditions, second trimester: Secondary | ICD-10-CM | POA: Diagnosis not present

## 2023-12-15 DIAGNOSIS — Z3A22 22 weeks gestation of pregnancy: Secondary | ICD-10-CM | POA: Diagnosis not present

## 2023-12-28 DIAGNOSIS — Z3A24 24 weeks gestation of pregnancy: Secondary | ICD-10-CM | POA: Diagnosis not present

## 2023-12-28 DIAGNOSIS — Z8632 Personal history of gestational diabetes: Secondary | ICD-10-CM | POA: Diagnosis not present

## 2023-12-28 DIAGNOSIS — Z3689 Encounter for other specified antenatal screening: Secondary | ICD-10-CM | POA: Diagnosis not present

## 2023-12-28 DIAGNOSIS — O09292 Supervision of pregnancy with other poor reproductive or obstetric history, second trimester: Secondary | ICD-10-CM | POA: Diagnosis not present

## 2023-12-28 DIAGNOSIS — Z362 Encounter for other antenatal screening follow-up: Secondary | ICD-10-CM | POA: Diagnosis not present

## 2024-01-25 DIAGNOSIS — Z3689 Encounter for other specified antenatal screening: Secondary | ICD-10-CM | POA: Diagnosis not present

## 2024-01-25 DIAGNOSIS — O99810 Abnormal glucose complicating pregnancy: Secondary | ICD-10-CM | POA: Diagnosis not present

## 2024-01-25 DIAGNOSIS — Z8632 Personal history of gestational diabetes: Secondary | ICD-10-CM | POA: Diagnosis not present

## 2024-01-25 DIAGNOSIS — Z3A28 28 weeks gestation of pregnancy: Secondary | ICD-10-CM | POA: Diagnosis not present

## 2024-01-25 DIAGNOSIS — O09293 Supervision of pregnancy with other poor reproductive or obstetric history, third trimester: Secondary | ICD-10-CM | POA: Diagnosis not present
